# Patient Record
Sex: Female | Born: 1975 | Race: Black or African American | Hispanic: No | Smoking: Never smoker
Health system: Southern US, Community
[De-identification: ages and names within clinical notes are randomized; demographics above are authoritative.]

## PROBLEM LIST (undated history)

## (undated) DIAGNOSIS — N3281 Overactive bladder: Secondary | ICD-10-CM

## (undated) DIAGNOSIS — F419 Anxiety disorder, unspecified: Secondary | ICD-10-CM

## (undated) DIAGNOSIS — G43909 Migraine, unspecified, not intractable, without status migrainosus: Secondary | ICD-10-CM

## (undated) DIAGNOSIS — E669 Obesity, unspecified: Secondary | ICD-10-CM

## (undated) DIAGNOSIS — F32A Depression, unspecified: Secondary | ICD-10-CM

## (undated) HISTORY — PX: DILATION AND CURETTAGE OF UTERUS: SHX78

## (undated) HISTORY — DX: Overactive bladder: N32.81

## (undated) HISTORY — DX: Depression, unspecified: F32.A

## (undated) HISTORY — DX: Anxiety disorder, unspecified: F41.9

## (undated) HISTORY — DX: Obesity, unspecified: E66.9

## (undated) HISTORY — DX: Migraine, unspecified, not intractable, without status migrainosus: G43.909

---

## 2009-01-24 DIAGNOSIS — F418 Other specified anxiety disorders: Secondary | ICD-10-CM | POA: Insufficient documentation

## 2009-01-24 DIAGNOSIS — G4700 Insomnia, unspecified: Secondary | ICD-10-CM | POA: Insufficient documentation

## 2021-08-13 ENCOUNTER — Encounter: Payer: Self-pay | Admitting: Pulmonary Disease

## 2021-08-13 ENCOUNTER — Ambulatory Visit: Payer: Federal, State, Local not specified - PPO | Admitting: Pulmonary Disease

## 2021-08-13 ENCOUNTER — Ambulatory Visit (HOSPITAL_COMMUNITY)
Admission: RE | Admit: 2021-08-13 | Discharge: 2021-08-13 | Disposition: A | Discharge status: Home / Self Care | Payer: Federal, State, Local not specified - PPO | Source: Ambulatory Visit | Attending: Pulmonary Disease | Admitting: Pulmonary Disease

## 2021-08-13 ENCOUNTER — Other Ambulatory Visit: Payer: Self-pay

## 2021-08-13 ENCOUNTER — Other Ambulatory Visit (HOSPITAL_COMMUNITY): Payer: Federal, State, Local not specified - PPO

## 2021-08-13 VITALS — BP 134/82 | HR 80 | Temp 98.1°F | Ht 63.0 in | Wt 252.4 lb

## 2021-08-13 DIAGNOSIS — Z789 Other specified health status: Secondary | ICD-10-CM | POA: Diagnosis not present

## 2021-08-13 DIAGNOSIS — Z801 Family history of malignant neoplasm of trachea, bronchus and lung: Secondary | ICD-10-CM | POA: Diagnosis not present

## 2021-08-13 DIAGNOSIS — R911 Solitary pulmonary nodule: Secondary | ICD-10-CM | POA: Diagnosis present

## 2021-08-13 NOTE — Patient Instructions (Signed)
Thank you for visiting Dr. Tonia Brooms at [facility]. ?Today we recommend the following: ? ?Orders Placed This Encounter  ?Procedures  ? CT Super D Chest Wo Contrast  ? ?Return if symptoms worsen or fail to improve. ?We we will get your CT chest and come up with a plan  ? ? ? ?Please do your part to reduce the spread of COVID-19.  ? ?

## 2021-08-13 NOTE — Progress Notes (Signed)
T CHest ? ?Synopsis: Referred in March 2023 for lung nodule by No ref. provider found ? ?Subjective:  ? ?PATIENT ID: Ebony Owens GENDER: female DOB: 1976/03/07, MRN: [id number] ? ?Chief Complaint  ?Patient presents with  ? Consult  ?  Nodule in left lung   ? ? ?PMH obesity, anxiety and depression. Recently seen in the ED in [place] for a cxr. Imaging showed a left sided 1.7 cm lung nodule. She has had some difficulty getting a CT completed.  Patient has a father that was diagnosed with lung cancer at age 7, with stage IV disease and he is now deceased.  She does not know her mother very well but she knows that she has diabetes and hypertension.  She has been super anxious since being told that she has a lung nodule. ? ? ?Past Medical History:  ?Diagnosis Date  ? Anxiety   ? Depression   ? Migraine   ? OAB (overactive bladder)   ? Obesity   ?  ? ?Family History  ?Problem Relation Age of Onset  ? Diabetes Mother   ? Hypertension Mother   ? Lung cancer Father   ?  ? ?Past Surgical History:  ?Procedure Laterality Date  ? DILATION AND CURETTAGE OF UTERUS    ? ? ?Social History  ? ?Socioeconomic History  ? Marital status: Divorced  ?  Spouse name: Not on file  ? Number of children: Not on file  ? Years of education: Not on file  ? Highest education level: Not on file  ?Occupational History  ? Not on file  ?Tobacco Use  ? Smoking status: Never  ?  Passive exposure: Never  ? Smokeless tobacco: Never  ?Substance and Sexual Activity  ? Alcohol use: Yes  ?  Alcohol/week: 1.0 standard drink  ?  Types: 1 Standard drinks or equivalent per week  ?  Comment: 1 per month  ? Drug use: Never  ? Sexual activity: Not on file  ?Other Topics Concern  ? Not on file  ?Social History Narrative  ? Not on file  ? ?Social Determinants of Health  ? ?Financial Resource Strain: Not on file  ?Food Insecurity: Not on file  ?Transportation Needs: Not on file  ?Physical Activity: Not on file  ?Stress: Not on file  ?Social Connections: Not on  file  ?Intimate Partner Violence: Not on file  ?  ? ?No Known Allergies  ? ?Outpatient Medications Prior to Visit  ?Medication Sig Dispense Refill  ? citalopram (CELEXA) 40 MG tablet Take 40 mg by mouth daily.    ? cyclobenzaprine (FLEXERIL) 10 MG tablet     ? diazepam (VALIUM) 5 MG tablet Take 5 mg by mouth 3 (three) times daily as needed.    ? Norethindrone Acetate-Ethinyl Estrad-FE (LOESTRIN 24 FE) 1-20 MG-MCG(24) tablet take 1 tab daily by mouth    ? oxybutynin (DITROPAN) 5 MG tablet Take 5 mg by mouth 2 (two) times daily.    ? SUMAtriptan (IMITREX) 100 MG tablet Take by mouth.    ? Vitamin D, Ergocalciferol, (DRISDOL) 1.25 MG (50000 UNIT) CAPS capsule     ? ?No facility-administered medications prior to visit.  ? ? ?Review of Systems  ?Constitutional:  Negative for chills, fever, malaise/fatigue and weight loss.  ?HENT:  Negative for hearing loss, sore throat and tinnitus.   ?Eyes:  Negative for blurred vision and double vision.  ?Respiratory:  Negative for cough, hemoptysis, sputum production, shortness of breath, wheezing and stridor.   ?  Cardiovascular:  Negative for chest pain, palpitations, orthopnea, leg swelling and PND.  ?Gastrointestinal:  Negative for abdominal pain, constipation, diarrhea, heartburn, nausea and vomiting.  ?Genitourinary:  Negative for dysuria, hematuria and urgency.  ?Musculoskeletal:  Negative for joint pain and myalgias.  ?Skin:  Negative for itching and rash.  ?Neurological:  Negative for dizziness, tingling, weakness and headaches.  ?Endo/Heme/Allergies:  Negative for environmental allergies. Does not bruise/bleed easily.  ?Psychiatric/Behavioral:  Negative for depression. The patient is not nervous/anxious and does not have insomnia.   ?All other systems reviewed and are negative. ? ? ?Objective:  ?Physical Exam ?Vitals reviewed.  ?Constitutional:   ?   General: She is not in acute distress. ?   Appearance: She is well-developed. She is obese.  ?HENT:  ?   Head: Normocephalic and  atraumatic.  ?Eyes:  ?   General: No scleral icterus. ?   Conjunctiva/sclera: Conjunctivae normal.  ?   Pupils: Pupils are equal, round, and reactive to light.  ?Neck:  ?   Vascular: No JVD.  ?   Trachea: No tracheal deviation.  ?Cardiovascular:  ?   Rate and Rhythm: Normal rate and regular rhythm.  ?   Heart sounds: Normal heart sounds. No murmur heard. ?Pulmonary:  ?   Effort: Pulmonary effort is normal. No tachypnea, accessory muscle usage or respiratory distress.  ?   Breath sounds: Normal breath sounds. No stridor. No wheezing, rhonchi or rales.  ?Abdominal:  ?   General: Bowel sounds are normal. There is no distension.  ?   Palpations: Abdomen is soft.  ?   Tenderness: There is no abdominal tenderness.  ?Musculoskeletal:     ?   General: No tenderness.  ?   Cervical back: Neck supple.  ?Lymphadenopathy:  ?   Cervical: No cervical adenopathy.  ?Skin: ?   General: Skin is warm and dry.  ?   Capillary Refill: Capillary refill takes less than 2 seconds.  ?   Findings: No rash.  ?Neurological:  ?   Mental Status: She is alert and oriented to person, place, and time.  ?Psychiatric:     ?   Behavior: Behavior normal.  ? ? ? ?Vitals:  ? 08/13/21 1500  ?BP: 134/82  ?Pulse: 80  ?Temp: 98.1 ?F (36.7 ?C)  ?TempSrc: Oral  ?SpO2: 99%  ?Weight: 252 lb 6.4 oz (114.5 kg)  ?Height: 5\' 3"  (1.6 m)  ? ?99% on RA ?BMI Readings from Last 3 Encounters:  ?08/13/21 44.71 kg/m?  ? ?Wt Readings from Last 3 Encounters:  ?08/13/21 252 lb 6.4 oz (114.5 kg)  ? ? ? ?CBC ?No results found for: WBC, RBC, HGB, HCT, PLT, MCV, MCH, MCHC, RDW, LYMPHSABS, MONOABS, EOSABS, BASOSABS ? ? ?Chest Imaging: ?Per patient report she was had a 1.7 cm left sided lung nodule on chest x-ray. ? ?Pulmonary Functions Testing Results: ?No flowsheet data found. ? ?FeNO:  ? ?Pathology:  ? ?Echocardiogram:  ? ?Heart Catheterization:  ?   ?Assessment & Plan:  ? ?  ICD-10-CM   ?1. Lung nodule  R91.1 CT Super D Chest Wo Contrast  ?  CANCELED: CT Chest Wo Contrast  ?  ?2.  Family history of lung cancer  Z80.1   ?  ?3. Non-smoker  Z78.9   ?  ? ? ?Discussion: ? ?This is a 46 year old obese female that was found to have a lung nodule on the left side on a chest x-ray in the ER in [place].  She has been very anxious  about this and has tried to get a CT scan done in [place] but she was unable to get it scheduled. ? ?Plan: ?We will try to get her CT scan scheduled this afternoon if at all possible.  She drove almost an hour to see [place] today we will do very best to try to get this scheduled to help expedite her evaluation. ?Once we get the CT scan I explained we would be better able to come up with a plan. ?If the nodule is still there then we will talk about next steps which may include a PET scan or even consideration for biopsy. ?I explained if the nodule is no longer there that she would likely need no additional follow-up. ?She was super thankful for [place] trying to get her worked in today and willing to see her on short notice. ? ? ? ? ?Current Outpatient Medications:  ?  citalopram (CELEXA) 40 MG tablet, Take 40 mg by mouth daily., Disp: , Rfl:  ?  cyclobenzaprine (FLEXERIL) 10 MG tablet, , Disp: , Rfl:  ?  diazepam (VALIUM) 5 MG tablet, Take 5 mg by mouth 3 (three) times daily as needed., Disp: , Rfl:  ?  Norethindrone Acetate-Ethinyl Estrad-FE (LOESTRIN 24 FE) 1-20 MG-MCG(24) tablet, take 1 tab daily by mouth, Disp: , Rfl:  ?  oxybutynin (DITROPAN) 5 MG tablet, Take 5 mg by mouth 2 (two) times daily., Disp: , Rfl:  ?  SUMAtriptan (IMITREX) 100 MG tablet, Take by mouth., Disp: , Rfl:  ?  Vitamin D, Ergocalciferol, (DRISDOL) 1.25 MG (50000 UNIT) CAPS capsule, , Disp: , Rfl:  ? ? ? ?Garner Nash, DO ?[facility] ?08/13/2021 3:19 PM   ?

## 2021-08-18 NOTE — Progress Notes (Signed)
Ty, please let the patient know that her CT scan shows no evidence of lung nodule.  No additional imaging follow-up needed. ? ?Thanks, ? ?BLI ? ?Josephine Igo, DO ?[facility] ?08/18/2021 10:55 AM  ?

## 2021-10-05 ENCOUNTER — Ambulatory Visit: Payer: Federal, State, Local not specified - PPO | Admitting: Medical-Surgical

## 2021-10-05 ENCOUNTER — Encounter: Payer: Self-pay | Admitting: Medical-Surgical

## 2021-10-05 ENCOUNTER — Ambulatory Visit: Payer: Federal, State, Local not specified - PPO

## 2021-10-05 VITALS — BP 123/81 | HR 74 | Resp 20 | Ht 63.0 in | Wt 248.3 lb

## 2021-10-05 DIAGNOSIS — R079 Chest pain, unspecified: Secondary | ICD-10-CM | POA: Diagnosis not present

## 2021-10-05 DIAGNOSIS — Z7689 Persons encountering health services in other specified circumstances: Secondary | ICD-10-CM | POA: Diagnosis not present

## 2021-10-05 DIAGNOSIS — F418 Other specified anxiety disorders: Secondary | ICD-10-CM

## 2021-10-05 DIAGNOSIS — I517 Cardiomegaly: Secondary | ICD-10-CM | POA: Diagnosis not present

## 2021-10-05 DIAGNOSIS — R7303 Prediabetes: Secondary | ICD-10-CM

## 2021-10-05 DIAGNOSIS — E559 Vitamin D deficiency, unspecified: Secondary | ICD-10-CM

## 2021-10-05 DIAGNOSIS — K219 Gastro-esophageal reflux disease without esophagitis: Secondary | ICD-10-CM

## 2021-10-05 DIAGNOSIS — N3941 Urge incontinence: Secondary | ICD-10-CM

## 2021-10-05 MED ORDER — PANTOPRAZOLE SODIUM 20 MG PO TBEC: 20.0000 mg | DELAYED_RELEASE_TABLET | Freq: Every day | ORAL | 1 refills | Status: DC

## 2021-10-05 MED ORDER — OXYBUTYNIN CHLORIDE 5 MG PO TABS: 5.0000 mg | ORAL_TABLET | Freq: Two times a day (BID) | ORAL | 1 refills | Status: DC

## 2021-10-05 NOTE — Progress Notes (Unsigned)
Enrolled for [employer] to mail a ZIO XT long term holter monitor to the patients address on file.  

## 2021-10-05 NOTE — Progress Notes (Signed)
? ?New Patient Office Visit ? ?Subjective:  ?Patient ID: Ebony Owens, female    DOB: [birth year]  Age: 46 y.o. MRN: [id number] ? ?CC:  ?Chief Complaint  ?Patient presents with  ? Establish Care  ? ?HPI ?Ebony Owens presents to establish care. 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 She currently lives in [place] and reports having a good experience with [facility] so thought to establish a primary care relationship with [place].  ? ?Migraines: using Imitrex 100mg  prn and flexeril 10mg  prn for migraine management with good results. Tolerates both medications well without side effects.  ? ?GERD: has been using OTC medications several times weekly and is interested in a medication that may help to keep her reflux in better control.  ? ?Mood: history of anxiety and depression, currently treated with Celexa 40mg  daily. Also has Valium 5mg  three times daily to use as needed. Notes that she can go several days before needing to take a Valium to manage her anxiety.  ? ?Right arm/wrist: has noted some intermittent numbness affecting the inner forearm and wrist area. No known injury or trauma. Works on and types quite a bit. Does not sleep on the right side.  ? ?Urinary incontinence: previously taking Ditropan 5mg  twice daily as needed which she found very helpful. Is out of the medication but would like a refill.  ? ?Most pressing are her concerns regarding her hospital visit on 07/08/2021. She developed chest pressure and was evaluated at [facility] in [place] [employer], [employer] [place]. She underwent testing there with no significant findings to explain her symptoms. Records are not available but at some point she had a chest CT that showed a lung nodule. She notes that her father was diagnosed at the age of 49 with lung cancer and subsequently passed away so this was terrifying for her. She discussed her findings with her prior PCP but reports that she was told not to worry about it. She worked to get in  with a pulmonologist for further evaluation and finally connected with Dr. 09/05/2021 at [facility]. She was able to see him back in March with a follow up CT showing resolution of the nodule but mild cardiomegaly noted. Has had no further workup regarding the cardiomegaly but is interested in pursuing this. Notes that she has continued to have the chest pressure, most notable with increased activity (specifically the treadmill on incline) and has stopped doing treadmill workouts because of it. She does not remember a specific issue with cardiac concerns before this but knows that there was something that went on in 2005-2006 while she was still in the 57. At that time, she had to wear a holter monitor for a couple of weeks but is not sure what the outcome was since she discharged from service shortly after and was lost to follow up.  ? ?Past Medical History:  ?Diagnosis Date  ? Anxiety   ? Depression   ? Migraine   ? OAB (overactive bladder)   ? Obesity   ? ? ?Past Surgical History:  ?Procedure Laterality Date  ? DILATION AND CURETTAGE OF UTERUS    ? ? ?Family History  ?Problem Relation Age of Onset  ? Diabetes Mother   ? Hypertension Mother   ? Lung cancer Father   ? Diabetes Maternal Grandmother   ? Colon cancer Maternal Grandfather   ? ? ?Social History  ? ?Socioeconomic History  ? Marital status: Divorced  ?  Spouse name: Not on file  ?  Number of children: Not on file  ? Years of education: Not on file  ? Highest education level: Not on file  ?Occupational History  ? Not on file  ?Tobacco Use  ? Smoking status: Never  ?  Passive exposure: Never  ? Smokeless tobacco: Never  ?Substance and Sexual Activity  ? Alcohol use: Yes  ?  Alcohol/week: 1.0 standard drink  ?  Types: 1 Standard drinks or equivalent per week  ?  Comment: 1 per month  ? Drug use: Never  ? Sexual activity: Yes  ?  Birth control/protection: Other-see comments  ?Other Topics Concern  ? Not on file  ?Social History Narrative  ? Not on file  ? ?Social  Determinants of Health  ? ?Financial Resource Strain: Not on file  ?Food Insecurity: Not on file  ?Transportation Needs: Not on file  ?Physical Activity: Not on file  ?Stress: Not on file  ?Social Connections: Not on file  ?Intimate Partner Violence: Not on file  ? ? ?ROS ?Review of Systems  ?Constitutional:  Negative for chills, fatigue, fever and unexpected weight change.  ?HENT:  Negative for congestion, rhinorrhea, sinus pressure and sore throat.   ?Respiratory:  Negative for cough, chest tightness and shortness of breath.   ?Cardiovascular:  Negative for chest pain, palpitations and leg swelling.  ?Gastrointestinal:  Negative for abdominal pain, constipation, diarrhea, nausea and vomiting.  ?     Heartburn  ?Endocrine: Negative for cold intolerance and heat intolerance.  ?Genitourinary:  Negative for dysuria, frequency, urgency, vaginal bleeding and vaginal discharge.  ?Musculoskeletal:  Positive for myalgias.  ?Skin:  Negative for rash and wound.  ?Neurological:  Positive for headaches. Negative for dizziness and light-headedness.  ?Hematological:  Does not bruise/bleed easily.  ?Psychiatric/Behavioral:  Positive for dysphoric mood. Negative for self-injury, sleep disturbance and suicidal ideas. The patient is nervous/anxious.   ? ?Objective:  ? ?Today's Vitals: BP 123/81 (BP Location: Right Arm, Cuff Size: Large)   Pulse 74   Resp 20   Ht  (1.6 m)   Wt 248 lb 4.8 oz (112.6 kg)   SpO2 99%   BMI 43.98 kg/m?  ? ?Physical Exam ?Vitals reviewed.  ?Constitutional:   ?   General: She is not in acute distress. ?   Appearance: Normal appearance. She is obese. She is not ill-appearing.  ?HENT:  ?   Head: Normocephalic and atraumatic.  ?Cardiovascular:  ?   Rate and Rhythm: Normal rate and regular rhythm.  ?   Pulses: Normal pulses.  ?   Heart sounds: Normal heart sounds. No murmur heard. ?  No friction rub. No gallop.  ?Pulmonary:  ?   Effort: Pulmonary effort is normal. No respiratory distress.  ?   Breath  sounds: Normal breath sounds. No wheezing.  ?Skin: ?   General: Skin is warm and dry.  ?Neurological:  ?   Mental Status: She is alert and oriented to person, place, and time.  ?Psychiatric:     ?   Mood and Affect: Mood normal.     ?   Behavior: Behavior normal.     ?   Thought Content: Thought content normal.     ?   Judgment: Judgment normal.  ? ? ?Assessment & Plan:  ? ?1. Encounter to establish care ?Reviewed available information and discussed care concerns with patient.  ? ?2. Chest pain, unspecified type ?Checking labs. In office EKG- NSR, rate 65, normal axis, no acute changes. Ordering echocardiogram and Zio patch for  further evaluation.  ?- ECHOCARDIOGRAM COMPLETE; Future ?- LONG TERM MONITOR (3-14 DAYS); Future ?- EKG 12-Lead ?- CBC with Differential/Platelet ?- COMPLETE METABOLIC PANEL WITH GFR ?- Lipid panel ?- TSH ? ?3. Cardiomegaly ?See above.  ?- ECHOCARDIOGRAM COMPLETE; Future ?- LONG TERM MONITOR (3-14 DAYS); Future ?- EKG 12-Lead ? ?4. Urge incontinence ?Refilling Ditropan 5mg  BID prn.  ? ?5. Prediabetes ?Checking A1c.  ?- Hemoglobin A1c ? ?6. Vitamin D deficiency ?Checking vitamin D.  ?- VITAMIN D 25 Hydroxy (Vit-D Deficiency, Fractures) ? ?7. Mixed anxiety depressive disorder ?Continue Celexa 40mg  daily. Urged very sparing use of Valium due to risk of dependence and tolerance.  ? ?8. Gastroesophageal reflux disease without esophagitis ?Start Protonix 20mg  daily. Recommend using this for the shortest time possible at the lowest effective dose. Once symptoms controlled, consider switching to famotidine.  ? ?Outpatient Encounter Medications as of 10/05/2021  ?Medication Sig  ? pantoprazole (PROTONIX) 20 MG tablet Take 1 tablet (20 mg total) by mouth daily.  ? citalopram (CELEXA) 40 MG tablet Take 40 mg by mouth daily.  ? cyclobenzaprine (FLEXERIL) 10 MG tablet   ? diazepam (VALIUM) 5 MG tablet Take 5 mg by mouth 3 (three) times daily as needed.  ? Norethindrone Acetate-Ethinyl Estrad-FE (LOESTRIN 24  FE) 1-20 MG-MCG(24) tablet take 1 tab daily by mouth  ? oxybutynin (DITROPAN) 5 MG tablet Take 1 tablet (5 mg total) by mouth 2 (two) times daily.  ? SUMAtriptan (IMITREX) 100 MG tablet Take by mouth.  ? Vitami

## 2021-10-06 LAB — TSH: TSH: 0.73 mIU/L

## 2021-10-06 LAB — CBC WITH DIFFERENTIAL/PLATELET
Absolute Monocytes: 653 cells/uL (ref 200–950)
Basophils Absolute: 73 cells/uL (ref 0–200)
Basophils Relative: 0.5 %
Eosinophils Absolute: 203 cells/uL (ref 15–500)
Eosinophils Relative: 1.4 %
HCT: 36.3 % (ref 35.0–45.0)
Hemoglobin: 12.1 g/dL (ref 11.7–15.5)
Lymphs Abs: 5568 cells/uL — ABNORMAL HIGH (ref 850–3900)
MCH: 29.7 pg (ref 27.0–33.0)
MCHC: 33.3 g/dL (ref 32.0–36.0)
MCV: 89.2 fL (ref 80.0–100.0)
MPV: 9.9 fL (ref 7.5–12.5)
Monocytes Relative: 4.5 %
Neutro Abs: 8004 cells/uL — ABNORMAL HIGH (ref 1500–7800)
Neutrophils Relative %: 55.2 %
Platelets: 412 10*3/uL — ABNORMAL HIGH (ref 140–400)
RBC: 4.07 10*6/uL (ref 3.80–5.10)
RDW: 14.3 % (ref 11.0–15.0)
Total Lymphocyte: 38.4 %
WBC: 14.5 10*3/uL — ABNORMAL HIGH (ref 3.8–10.8)

## 2021-10-06 LAB — COMPLETE METABOLIC PANEL WITH GFR
AG Ratio: 1.1 (calc) (ref 1.0–2.5)
ALT: 12 U/L (ref 6–29)
AST: 11 U/L (ref 10–35)
Albumin: 3.9 g/dL (ref 3.6–5.1)
Alkaline phosphatase (APISO): 98 U/L (ref 31–125)
BUN: 10 mg/dL (ref 7–25)
CO2: 28 mmol/L (ref 20–32)
Calcium: 9.6 mg/dL (ref 8.6–10.2)
Chloride: 102 mmol/L (ref 98–110)
Creat: 0.82 mg/dL (ref 0.50–0.99)
Globulin: 3.6 g/dL (calc) (ref 1.9–3.7)
Glucose, Bld: 82 mg/dL (ref 65–139)
Potassium: 4.6 mmol/L (ref 3.5–5.3)
Sodium: 137 mmol/L (ref 135–146)
Total Bilirubin: 0.3 mg/dL (ref 0.2–1.2)
Total Protein: 7.5 g/dL (ref 6.1–8.1)
eGFR: 90 mL/min/{1.73_m2} (ref >=60)

## 2021-10-06 LAB — LIPID PANEL
Cholesterol: 191 mg/dL (ref <200)
HDL: 65 mg/dL (ref >=50)
LDL Cholesterol (Calc): 111 mg/dL (calc) — ABNORMAL HIGH
Non-HDL Cholesterol (Calc): 126 mg/dL (calc) (ref <130)
Total CHOL/HDL Ratio: 2.9 (calc) (ref <5.0)
Triglycerides: 68 mg/dL (ref <150)

## 2021-10-06 LAB — HEMOGLOBIN A1C
Hgb A1c MFr Bld: 6.2 % of total Hgb — ABNORMAL HIGH (ref <5.7)
Mean Plasma Glucose: 131 mg/dL
eAG (mmol/L): 7.3 mmol/L

## 2021-10-06 LAB — VITAMIN D 25 HYDROXY (VIT D DEFICIENCY, FRACTURES): Vit D, 25-Hydroxy: 46 ng/mL (ref 30–100)

## 2021-10-08 ENCOUNTER — Telehealth: Payer: Self-pay

## 2021-10-08 ENCOUNTER — Other Ambulatory Visit: Payer: Self-pay | Admitting: Medical-Surgical

## 2021-10-08 MED ORDER — METFORMIN HCL ER 500 MG PO TB24: 500.0000 mg | ORAL_TABLET | Freq: Every day | ORAL | 1 refills | Status: DC

## 2021-10-08 NOTE — Telephone Encounter (Signed)
Patient stated that she was talking to someone about possibly trying Metformin and she wants to try the lowest dose. ?

## 2021-10-21 ENCOUNTER — Ambulatory Visit (HOSPITAL_BASED_OUTPATIENT_CLINIC_OR_DEPARTMENT_OTHER): Payer: Federal, State, Local not specified - PPO

## 2021-10-22 ENCOUNTER — Encounter: Payer: Self-pay | Admitting: Neurology

## 2021-10-22 NOTE — Telephone Encounter (Signed)
Mychart message sent to patient.

## 2021-10-27 ENCOUNTER — Other Ambulatory Visit: Payer: Self-pay | Admitting: Medical-Surgical

## 2021-11-14 ENCOUNTER — Encounter: Payer: Self-pay | Admitting: Medical-Surgical

## 2021-11-27 ENCOUNTER — Ambulatory Visit (HOSPITAL_BASED_OUTPATIENT_CLINIC_OR_DEPARTMENT_OTHER)
Admission: RE | Admit: 2021-11-27 | Discharge: 2021-11-27 | Disposition: A | Discharge status: Home / Self Care | Payer: Federal, State, Local not specified - PPO | Source: Ambulatory Visit | Attending: Medical-Surgical | Admitting: Medical-Surgical

## 2021-11-27 DIAGNOSIS — R079 Chest pain, unspecified: Secondary | ICD-10-CM | POA: Insufficient documentation

## 2021-11-27 DIAGNOSIS — I517 Cardiomegaly: Secondary | ICD-10-CM | POA: Insufficient documentation

## 2021-11-27 NOTE — Progress Notes (Signed)
  Echocardiogram 2D Echocardiogram has been performed.  Ebony Owens F 11/27/2021, 8:22 AM

## 2021-11-30 LAB — ECHOCARDIOGRAM COMPLETE
AR max vel: 2.09 cm2
AV Area VTI: 1.95 cm2
AV Area mean vel: 1.88 cm2
AV Mean grad: 5 mmHg
AV Peak grad: 9 mmHg
Ao pk vel: 1.5 m/s
Area-P 1/2: 3.28 cm2
S' Lateral: 3.4 cm

## 2021-12-30 ENCOUNTER — Other Ambulatory Visit: Payer: Self-pay | Admitting: Medical-Surgical

## 2021-12-30 MED ORDER — SUMATRIPTAN SUCCINATE 100 MG PO TABS: 100.0000 mg | ORAL_TABLET | Freq: Once | ORAL | 5 refills | Status: DC | PRN

## 2021-12-30 MED ORDER — METFORMIN HCL ER 500 MG PO TB24: 500.0000 mg | ORAL_TABLET | Freq: Every day | ORAL | 1 refills | Status: DC

## 2021-12-30 MED ORDER — CYCLOBENZAPRINE HCL 10 MG PO TABS: 5.0000 mg | ORAL_TABLET | Freq: Three times a day (TID) | ORAL | 1 refills | Status: DC | PRN

## 2021-12-30 MED ORDER — OXYBUTYNIN CHLORIDE 5 MG PO TABS: 5.0000 mg | ORAL_TABLET | Freq: Two times a day (BID) | ORAL | 1 refills | Status: AC

## 2021-12-30 MED ORDER — NORETHIN ACE-ETH ESTRAD-FE 1-20 MG-MCG(24) PO TABS: ORAL_TABLET | ORAL | 3 refills | Status: AC

## 2021-12-30 MED ORDER — PANTOPRAZOLE SODIUM 20 MG PO TBEC: 20.0000 mg | DELAYED_RELEASE_TABLET | Freq: Every day | ORAL | 3 refills | Status: AC

## 2021-12-30 MED ORDER — CITALOPRAM HYDROBROMIDE 40 MG PO TABS
40.0000 mg | ORAL_TABLET | Freq: Every day | ORAL | 3 refills | Status: AC
Start: 2021-12-30 — End: ?

## 2022-01-23 ENCOUNTER — Other Ambulatory Visit: Payer: Self-pay | Admitting: Medical-Surgical

## 2022-01-31 ENCOUNTER — Other Ambulatory Visit: Payer: Self-pay | Admitting: Medical-Surgical

## 2022-03-12 ENCOUNTER — Other Ambulatory Visit: Payer: Self-pay | Admitting: Medical-Surgical

## 2022-03-31 ENCOUNTER — Other Ambulatory Visit: Payer: Self-pay | Admitting: Medical-Surgical

## 2022-06-11 ENCOUNTER — Other Ambulatory Visit: Payer: Self-pay | Admitting: Medical-Surgical

## 2022-07-16 ENCOUNTER — Other Ambulatory Visit: Payer: Self-pay

## 2022-08-11 ENCOUNTER — Other Ambulatory Visit: Payer: Self-pay | Admitting: Medical-Surgical

## 2022-08-20 ENCOUNTER — Other Ambulatory Visit: Payer: Self-pay | Admitting: Medical-Surgical

## 2022-08-25 ENCOUNTER — Other Ambulatory Visit: Payer: Self-pay | Admitting: Medical-Surgical

## 2022-08-26 ENCOUNTER — Other Ambulatory Visit: Payer: Self-pay | Admitting: Medical-Surgical

## 2022-08-27 ENCOUNTER — Other Ambulatory Visit: Payer: Self-pay | Admitting: Medical-Surgical

## 2023-07-23 IMAGING — CT CT CHEST SUPER D W/O CM
2 of 4 series · 15 of 36 positions shown, 18 images · non-contrast
Comparison: Clinic note of earlier today.

CLINICAL DATA: Seen in emergency room in [place] with x-ray
showing a left-sided 1.7 cm lung nodule. Nonsmoker.

EXAM:
CT CHEST WITHOUT CONTRAST
TECHNIQUE: Multidetector CT imaging of the chest was performed using thin slice
collimation for electromagnetic bronchoscopy planning purposes,
without intravenous contrast.
RADIATION DOSE REDUCTION: This exam was performed according to the
departmental dose-optimization program which includes automated
exposure control, adjustment of the mA and/or kV according to
patient size and/or use of iterative reconstruction technique.

[Series 4: coronal · coronal · 0.59mm/px · 3 of 168 slices shown]
[im 34/168  lung]
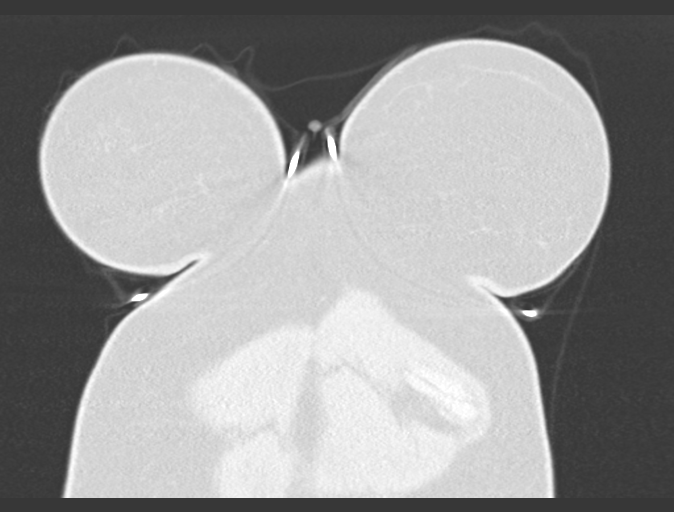
[im 67/168  lung]
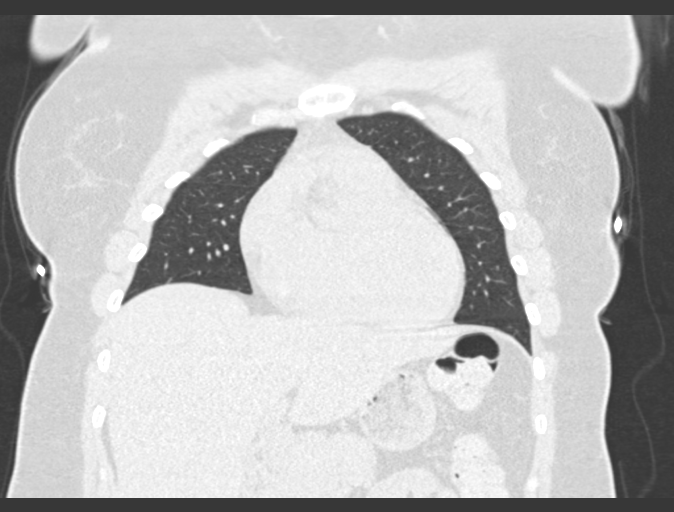
[im 101/168  lung]
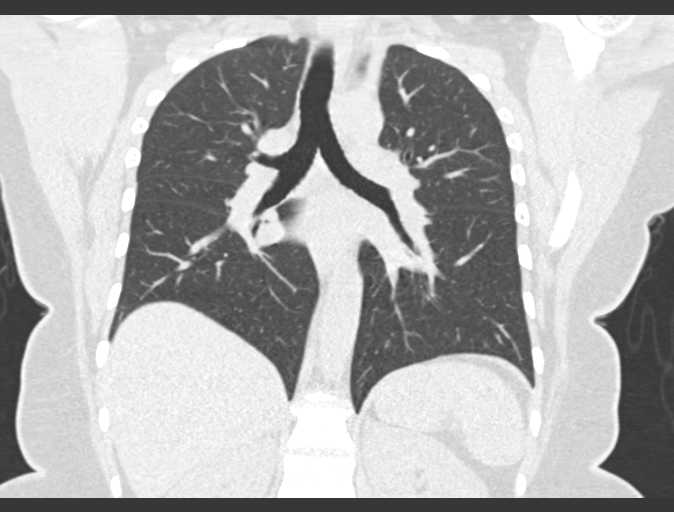

[Series 7: lungs · axial · 0.98mm/px · z∈[-378,-126]mm · 12 of 142 slices shown, 15 images]
[im 8/142  mediastinal]
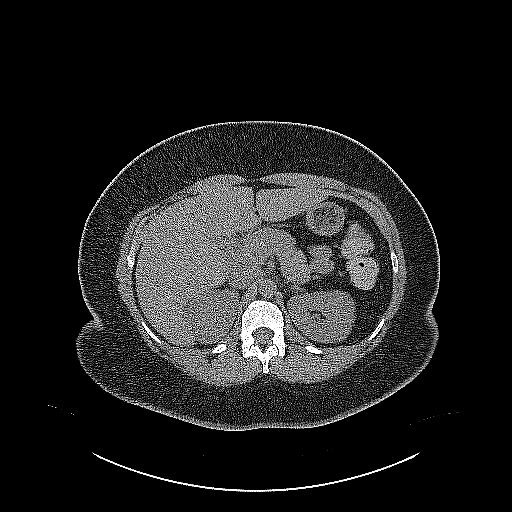
[im 8/142  lung]
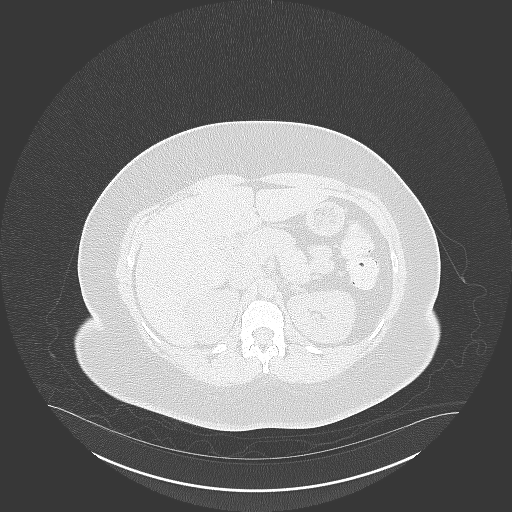
[im 23/142  lung]
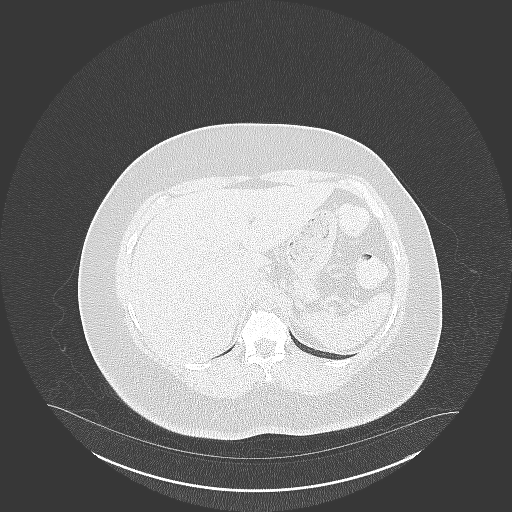
[im 30/142  lung]
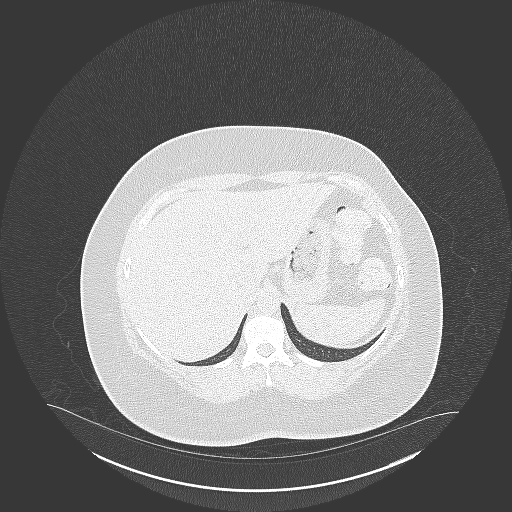
[im 45/142  lung]
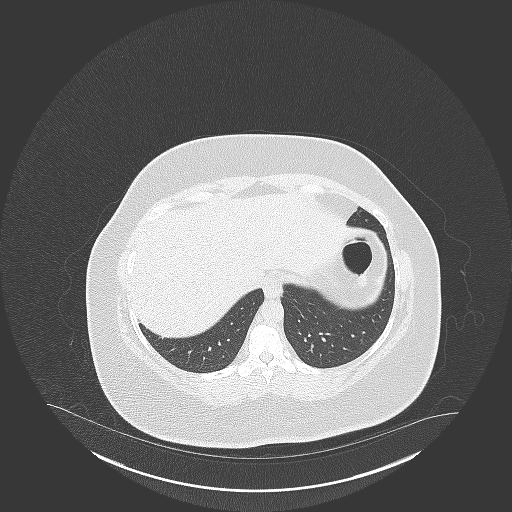
[im 52/142  mediastinal]
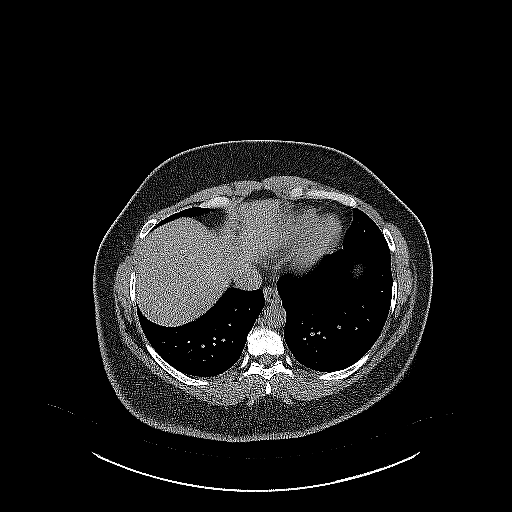
[im 52/142  lung]
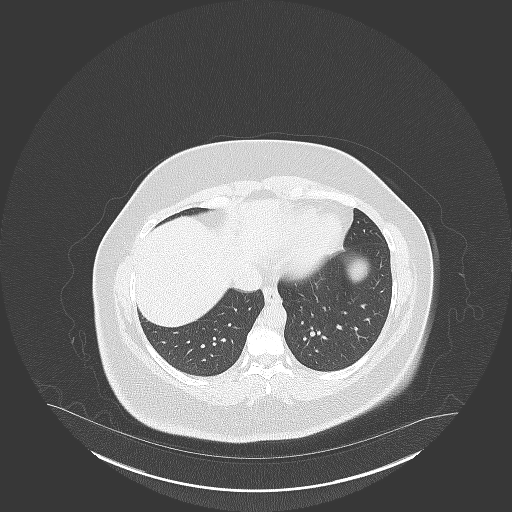
[im 67/142  lung]
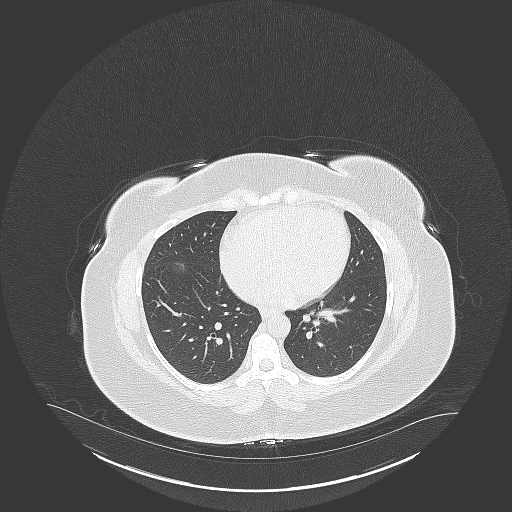
[im 75/142  lung]
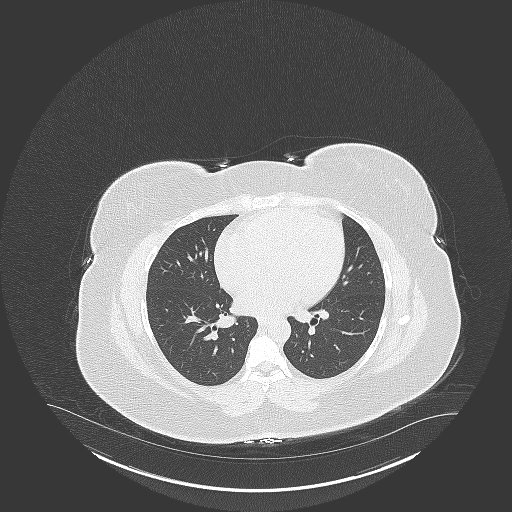
[im 90/142  lung]
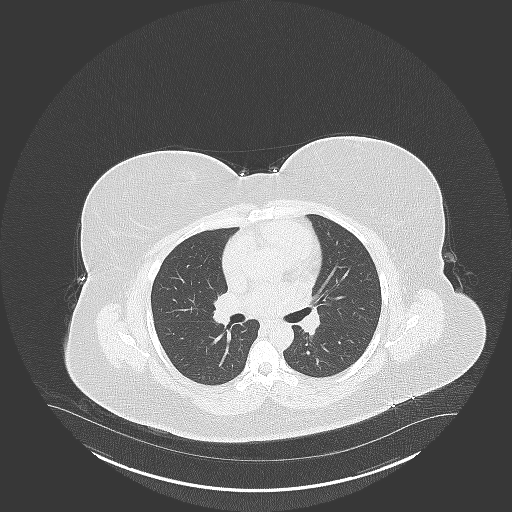
[im 97/142  mediastinal]
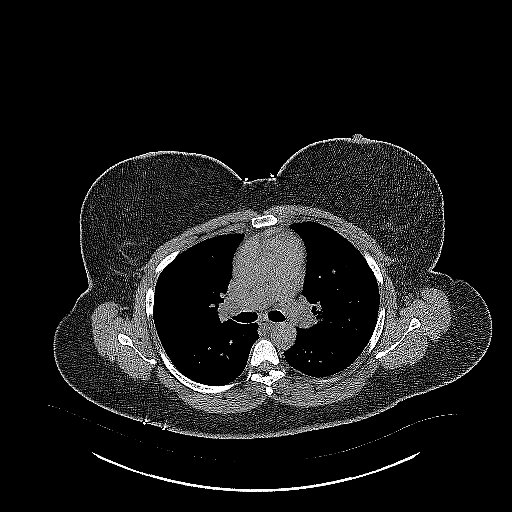
[im 97/142  lung]
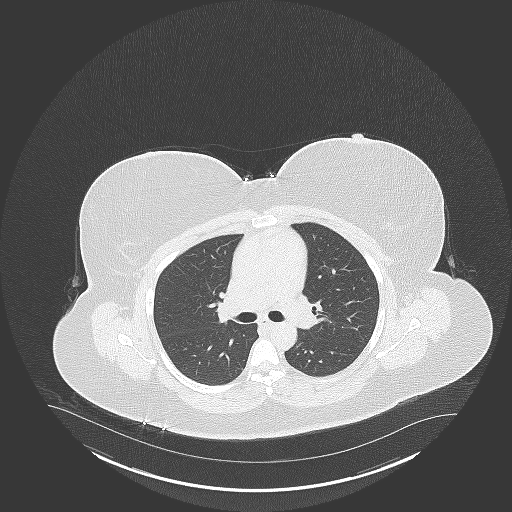
[im 112/142  lung]
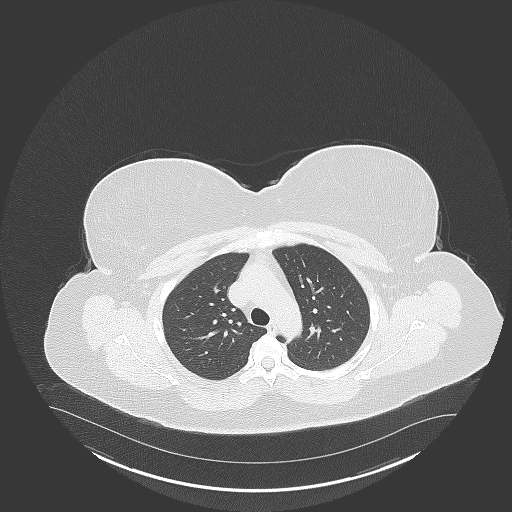
[im 119/142  lung]
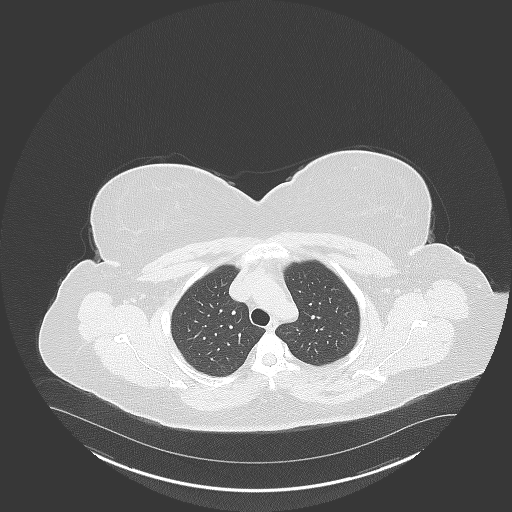
[im 134/142  lung]
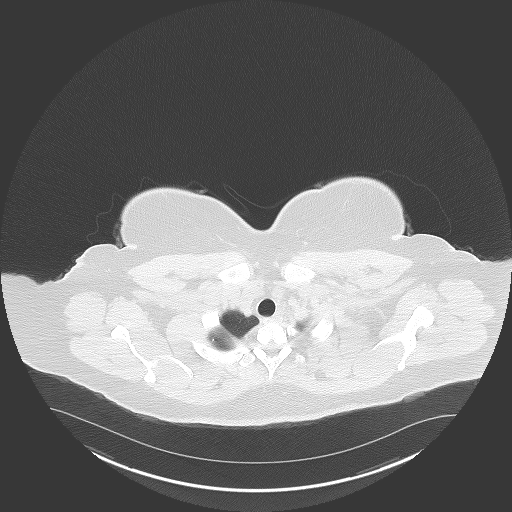

[15 of 36 positions shown; findings below may reference images not displayed]

FINDINGS: Cardiovascular: Normal caliber of the aorta and branch vessels. Mild
cardiomegaly, without pericardial effusion.

Mediastinum/Nodes: No mediastinal or definite hilar adenopathy,
given limitations of unenhanced CT.

Lungs/Pleura: No pleural fluid. Clear lungs. No evidence of
pulmonary nodule, with special attention paid to the left lung.

Upper Abdomen: Normal imaged portions of the liver, spleen, stomach,
pancreas, adrenal glands, kidneys.

Musculoskeletal: No acute osseous abnormality.
IMPRESSION: 1. No evidence of pulmonary nodule, including within the left lung.
2. Mild cardiomegaly.
